# Patient Record
Sex: Male | Born: 1980 | Race: White | Hispanic: No | Marital: Single | Smoking: Never smoker
Health system: Southern US, Community
[De-identification: ages and names within clinical notes are randomized; demographics above are authoritative.]

---
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
## 2004-09-25 ENCOUNTER — Encounter: Admission: RE | Admit: 2004-09-25 | Discharge: 2004-10-08 | Payer: Self-pay | Admitting: Family Medicine

## 2014-09-30 ENCOUNTER — Encounter (HOSPITAL_BASED_OUTPATIENT_CLINIC_OR_DEPARTMENT_OTHER): Payer: Self-pay

## 2014-09-30 ENCOUNTER — Emergency Department (HOSPITAL_BASED_OUTPATIENT_CLINIC_OR_DEPARTMENT_OTHER): Payer: BLUE CROSS/BLUE SHIELD

## 2014-09-30 ENCOUNTER — Emergency Department (HOSPITAL_BASED_OUTPATIENT_CLINIC_OR_DEPARTMENT_OTHER)
Admission: EM | Admit: 2014-09-30 | Discharge: 2014-09-30 | Disposition: A | Discharge status: Home / Self Care | Payer: BLUE CROSS/BLUE SHIELD | Attending: Emergency Medicine | Admitting: Emergency Medicine

## 2014-09-30 DIAGNOSIS — Y939 Activity, unspecified: Secondary | ICD-10-CM | POA: Insufficient documentation

## 2014-09-30 DIAGNOSIS — X30XXXA Exposure to excessive natural heat, initial encounter: Secondary | ICD-10-CM | POA: Diagnosis not present

## 2014-09-30 DIAGNOSIS — R410 Disorientation, unspecified: Secondary | ICD-10-CM

## 2014-09-30 DIAGNOSIS — R4182 Altered mental status, unspecified: Secondary | ICD-10-CM | POA: Diagnosis present

## 2014-09-30 DIAGNOSIS — F10120 Alcohol abuse with intoxication, uncomplicated: Secondary | ICD-10-CM | POA: Diagnosis not present

## 2014-09-30 DIAGNOSIS — F10920 Alcohol use, unspecified with intoxication, uncomplicated: Secondary | ICD-10-CM

## 2014-09-30 DIAGNOSIS — Y9289 Other specified places as the place of occurrence of the external cause: Secondary | ICD-10-CM | POA: Insufficient documentation

## 2014-09-30 DIAGNOSIS — T675XXA Heat exhaustion, unspecified, initial encounter: Secondary | ICD-10-CM

## 2014-09-30 DIAGNOSIS — Y999 Unspecified external cause status: Secondary | ICD-10-CM | POA: Insufficient documentation

## 2014-09-30 LAB — URINALYSIS, ROUTINE W REFLEX MICROSCOPIC
BILIRUBIN URINE: NEGATIVE
GLUCOSE, UA: NEGATIVE mg/dL
HGB URINE DIPSTICK: NEGATIVE
Ketones, ur: NEGATIVE mg/dL
LEUKOCYTES UA: NEGATIVE
NITRITE: NEGATIVE
PROTEIN: NEGATIVE mg/dL
Specific Gravity, Urine: 1.009 (ref 1.005–1.030)
Urobilinogen, UA: 0.2 mg/dL (ref 0.0–1.0)
pH: 5.5 (ref 5.0–8.0)

## 2014-09-30 LAB — COMPREHENSIVE METABOLIC PANEL
ALT: 46 U/L (ref 17–63)
ANION GAP: 10 (ref 5–15)
AST: 29 U/L (ref 15–41)
Albumin: 4.4 g/dL (ref 3.5–5.0)
Alkaline Phosphatase: 49 U/L (ref 38–126)
BILIRUBIN TOTAL: 0.2 mg/dL — AB (ref 0.3–1.2)
BUN: 16 mg/dL (ref 6–20)
CHLORIDE: 107 mmol/L (ref 101–111)
CO2: 22 mmol/L (ref 22–32)
CREATININE: 1.16 mg/dL (ref 0.61–1.24)
Calcium: 8.2 mg/dL — ABNORMAL LOW (ref 8.9–10.3)
Glucose, Bld: 111 mg/dL — ABNORMAL HIGH (ref 65–99)
Potassium: 3.7 mmol/L (ref 3.5–5.1)
Sodium: 139 mmol/L (ref 135–145)
TOTAL PROTEIN: 7.4 g/dL (ref 6.5–8.1)

## 2014-09-30 LAB — CBC WITH DIFFERENTIAL/PLATELET
BASOS ABS: 0 10*3/uL (ref 0.0–0.1)
Basophils Relative: 1 % (ref 0–1)
Eosinophils Absolute: 0.1 10*3/uL (ref 0.0–0.7)
Eosinophils Relative: 2 % (ref 0–5)
HEMATOCRIT: 42 % (ref 39.0–52.0)
Hemoglobin: 14 g/dL (ref 13.0–17.0)
LYMPHS PCT: 42 % (ref 12–46)
Lymphs Abs: 2.7 10*3/uL (ref 0.7–4.0)
MCH: 31.5 pg (ref 26.0–34.0)
MCHC: 33.3 g/dL (ref 30.0–36.0)
MCV: 94.4 fL (ref 78.0–100.0)
MONOS PCT: 7 % (ref 3–12)
Monocytes Absolute: 0.4 10*3/uL (ref 0.1–1.0)
Neutro Abs: 3.3 10*3/uL (ref 1.7–7.7)
Neutrophils Relative %: 50 % (ref 43–77)
Platelets: 248 10*3/uL (ref 150–400)
RBC: 4.45 MIL/uL (ref 4.22–5.81)
RDW: 13.6 % (ref 11.5–15.5)
WBC: 6.6 10*3/uL (ref 4.0–10.5)

## 2014-09-30 LAB — RAPID URINE DRUG SCREEN, HOSP PERFORMED
Amphetamines: NOT DETECTED
BARBITURATES: NOT DETECTED
Benzodiazepines: NOT DETECTED
Cocaine: NOT DETECTED
Opiates: NOT DETECTED
Tetrahydrocannabinol: NOT DETECTED

## 2014-09-30 LAB — ETHANOL: Alcohol, Ethyl (B): 254 mg/dL — ABNORMAL HIGH (ref <5)

## 2014-09-30 LAB — TROPONIN I

## 2014-09-30 MED ORDER — SODIUM CHLORIDE 0.9 % IV BOLUS (SEPSIS)
1000.0000 mL | Freq: Once | INTRAVENOUS | Status: AC
  Administered 2014-09-30: 1000 mL via INTRAVENOUS

## 2014-09-30 MED ORDER — SODIUM CHLORIDE 0.9 % IV SOLN
1000.0000 mL | Freq: Once | INTRAVENOUS | Status: AC
  Administered 2014-09-30: 1000 mL via INTRAVENOUS

## 2014-09-30 NOTE — ED Notes (Addendum)
Pt presents with decreased LOC, shortness of breath, brother in law at side and report patient with ETOH use today. Pt lethargic, but verbal, answers questions appropriately. Pt was at the pool (since 1430) when his girlfriend called brother in law because she could not arouse patient. PT reports drinking several powerade drinks this morning, 10 beers, and 2 bottles of water. No obvious injuries noted. Pt's brother in law denies any incidents/injuries on scene.

## 2014-09-30 NOTE — ED Provider Notes (Signed)
CSN: [id number]     Arrival date & time 09/30/14  1908 History  This chart was scribed for Arby Barrette, MD by Abel Presto, ED Scribe. This patient was seen in room MH01/MH01 and the patient's care was started at 7:41 PM.    Chief Complaint  Patient presents with  . Altered Mental Status     The history is provided by the patient and a friend. No language interpreter was used.   HPI Comments: Evan Hall is a 34 y.o. male who presents to the Emergency Department complaining of AMS with onset around 6:45 PM. Pt was at the pool since 2:30 PM, stating he was in the water during most of the time. Pt drank approximately 10 beers during the course of 6 hours. Pt states he got out of the pool and his "stomach got upset" so he went to use the restroom. He states he came back and sat down and reports "I just couldn't function, I was out of it, couldn't put thoughts together, kinda like a zombie mode." Pt notes "funny feeling" in lower posterior head. Per nursing noted girlfriend reported associated LOC. Pt states he does not feel at baseline yet. He reports he feels lethargic and weak. He denies any pain. Pt denies any significant PMHx of FHx. Pt denies known injury, fever, chills, and recent illness. Pt A&O x 4.   History reviewed. No pertinent past medical history. History reviewed. No pertinent past surgical history. History reviewed. No pertinent family history. History  Substance Use Topics  . Smoking status: Never Smoker   . Smokeless tobacco: Not on file  . Alcohol Use: Yes     Comment: daily - today -     Review of Systems 10 Systems reviewed and all are negative for acute change except as noted in the HPI.     Allergies  Review of patient's allergies indicates no known allergies.  Home Medications   Prior to Admission medications   Medication Sig Start Date End Date Taking? Authorizing Provider  Esomeprazole Magnesium (NEXIUM PO) Take by mouth.   Yes Historical Provider,  MD   BP 113/76 mmHg  Pulse 66  Temp(Src) 98.1 F (36.7 C) (Oral)  Resp 18  Ht 6\' 2"  (1.88 m)  Wt 235 lb (106.595 kg)  BMI 30.16 kg/m2  SpO2 100% Physical Exam  Constitutional: He is oriented to person, place, and time. He appears well-developed and well-nourished.  The patient appears mildly somnolent and slightly delayed in his responses but appropriate. No respiratory distress. GCS is 15.  HENT:  Head: Normocephalic and atraumatic.  Right Ear: External ear normal.  Left Ear: External ear normal.  Nose: Nose normal.  Mouth/Throat: Oropharynx is clear and moist. No oropharyngeal exudate.  Eyes: EOM are normal. Pupils are equal, round, and reactive to light.  Mild conjunctival: Injection bilaterally. Patient has lateral my nystagmus in both directions with far gaze.  Neck: Neck supple.  No C-spine tenderness.  Cardiovascular: Normal rate, regular rhythm, normal heart sounds and intact distal pulses.   Pulmonary/Chest: Effort normal and breath sounds normal.  Abdominal: Soft. Bowel sounds are normal. He exhibits no distension. There is no tenderness.  Musculoskeletal: Normal range of motion. He exhibits no edema.  Neurological: He is alert and oriented to person, place, and time. He has normal strength. No cranial nerve deficit. He exhibits normal muscle tone. Coordination normal. GCS eye subscore is 4. GCS verbal subscore is 5. GCS motor subscore is 6.  Skin: Skin is  warm, dry and intact.  Psychiatric: He has a normal mood and affect.    ED Course  Procedures (including critical care time) DIAGNOSTIC STUDIES: Oxygen Saturation is 100% on room air, normal by my interpretation.    COORDINATION OF CARE: 7:48 PM Discussed treatment plan with patient at beside, the patient agrees with the plan and has no further questions at this time.   Labs Review Labs Reviewed  COMPREHENSIVE METABOLIC PANEL - Abnormal; Notable for the following:    Glucose, Bld 111 (*)    Calcium 8.2 (*)     Total Bilirubin 0.2 (*)    All other components within normal limits  ETHANOL - Abnormal; Notable for the following:    Alcohol, Ethyl (B) 254 (*)    All other components within normal limits  URINE CULTURE  CBC WITH DIFFERENTIAL/PLATELET  URINALYSIS, ROUTINE W REFLEX MICROSCOPIC (NOT AT [facility])  URINE RAPID DRUG SCREEN, HOSP PERFORMED  TROPONIN I    Imaging Review Ct Head Wo Contrast  09/30/2014   CLINICAL DATA:  Altered mental status beginning 90 minutes ago.  EXAM: CT HEAD WITHOUT CONTRAST  TECHNIQUE: Contiguous axial images were obtained from the base of the skull through the vertex without intravenous contrast.  COMPARISON:  None.  FINDINGS: The study is somewhat degraded by motion artifact. Allowing for that, the examination is normal. No evidence of old or acute infarction, mass lesion, hemorrhage, hydrocephalus or extra-axial collection. Calvarium is unremarkable. Sinuses, middle ears and mastoids are clear.  IMPRESSION: Motion degraded but normal appearing exam.   Electronically Signed   By: Paulina Fusi M.D.   On: 09/30/2014 20:27     EKG Interpretation None     Recheck at 2245 patient is much improved. Mental status is clear. He has been interactive with family members. He is remained oriented with no signs of confusion MDM   Final diagnoses:  Heat exhaustion, initial encounter  Alcohol intoxication, uncomplicated  Disorientation   Patient presents with mental status change after having been at the pool for much of the day. The patient had had approximately 10 beers over the course of 6 hours and had been outside at the pool for a number of hours. There was no reported injury. The patient reports he just felt very disoriented and had to lie down without any localizing neurologic dysfunction. CT head does not show evidence of intracranial bleed or occult injury. Patient did not have documented fever. At this time symptoms are most suggestive of heat exhaustion with alcohol  intoxication. The patient's symptoms have resolved with administration of fluids and time.    Arby Barrette, MD 09/30/14 2257

## 2014-09-30 NOTE — Discharge Instructions (Signed)
Confusion Confusion is the inability to think with your usual speed or clarity. Confusion may come on quickly or slowly over time. How quickly the confusion comes on depends on the cause. Confusion can be due to any number of causes. CAUSES   Concussion, head injury, or head trauma.  Seizures.  Stroke.  Fever.  Brain tumor.  Age related decreased brain function (dementia).  Heightened emotional states like rage or terror.  Mental illness in which the person loses the ability to determine what is real and what is not (hallucinations).  Infections such as a urinary tract infection (UTI).  Toxic effects from alcohol, drugs, or prescription medicines.  Dehydration and an imbalance of salts in the body (electrolytes).  Lack of sleep.  Low blood sugar (diabetes).  Low levels of oxygen from conditions such as chronic lung disorders.  Drug interactions or other medicine side effects.  Nutritional deficiencies, especially niacin, thiamine, vitamin C, or vitamin B.  Sudden drop in body temperature (hypothermia).  Change in routine, such as when traveling or hospitalized. SIGNS AND SYMPTOMS  People often describe their thinking as cloudy or unclear when they are confused. Confusion can also include feeling disoriented. That means you are unaware of where or who you are. You may also not know what the date or time is. If confused, you may also have difficulty paying attention, remembering, and making decisions. Some people also act aggressively when they are confused.  DIAGNOSIS  The medical evaluation of confusion may include:  Blood and urine tests.  X-rays.  Brain and nervous system tests.  Analyzing your brain waves (electroencephalogram or EEG).  Magnetic resonance imaging (MRI) of your head.  Computed tomography (CT) scan of your head.  Mental status tests in which your health care provider may ask many questions. Some of these questions may seem silly or strange,  but they are a very important test to help diagnose and treat confusion. TREATMENT  An admission to the hospital may not be needed, but a person with confusion should not be left alone. Stay with a family member or friend until the confusion clears. Avoid alcohol, pain relievers, or sedative drugs until you have fully recovered. Do not drive until directed by your health care provider. HOME CARE INSTRUCTIONS  What family and friends can do:  To find out if someone is confused, ask the person to state his or her name, age, and the date. If the person is unsure or answers incorrectly, he or she is confused.  Always introduce yourself, no matter how well the person knows you.  Often remind the person of his or her location.  Place a calendar and clock near the confused person.  Help the person with his or her medicines. You may want to use a pill box, an alarm as a reminder, or give the person each dose as prescribed.  Talk about current events and plans for the day.  Try to keep the environment calm, quiet, and peaceful.  Make sure the person keeps follow-up visits with his or her health care provider. PREVENTION  Ways to prevent confusion:  Avoid alcohol.  Eat a balanced diet.  Get enough sleep.  Take medicine only as directed by your health care provider.  Do not become isolated. Spend time with other people and make plans for your days.  Keep careful watch on your blood sugar levels if you are diabetic. SEEK IMMEDIATE MEDICAL CARE IF:   You develop severe headaches, repeated vomiting, seizures, blackouts, or  slurred speech.  There is increasing confusion, weakness, numbness, restlessness, or personality changes.  You develop a loss of balance, have marked dizziness, feel uncoordinated, or fall.  You have delusions, hallucinations, or develop severe anxiety.  Your family members think you need to be rechecked. Document Released: 05/15/2004 Document Revised: 08/22/2013  Document Reviewed: 05/13/2013 [facility] Patient Information 2015 [place], [place]. This information is not intended to replace advice given to you by your health care provider. Make sure you discuss any questions you have with your health care provider. Heat-Related Illness Heat-related illnesses occur when the body is unable to properly cool itself. The body normally cools itself by sweating. However, under some conditions sweating is not enough. In these cases, a person's body temperature rises rapidly. Very high body temperatures may damage the brain or other vital organs. Some examples of heat-related illnesses include:  Heat stroke. This occurs when the body is unable to regulate its temperature. The body's temperature rises rapidly, the sweating mechanism fails, and the body is unable to cool down. Body temperature may rise to 106 F (41 C) or higher within 10 to 15 minutes. Heat stroke can cause death or permanent disability if emergency treatment is not provided.  Heat exhaustion. This is a milder form of heat-related illness that can develop after several days of exposure to high temperatures and not enough fluids. It is the body's response to an excessive loss of the water and salt contained in sweat.  Heat cramps. These usually affect people who sweat a lot during heavy activity. This sweating drains the body's salt and moisture. The low salt level in the muscles causes painful cramps. Heat cramps may also be a symptom of heat exhaustion. Heat cramps usually occur in the abdomen, arms, or legs. Get medical attention for cramps if you have heart problems or are on a low-sodium diet. Those that are at greatest risk for heat-related illnesses include:   The elderly.  Infant and the very young.  People with mental illness and chronic diseases.  People who are overweight (obese).  Young and healthy people can even succumb to heat if they participate in strenuous physical activities during  hot weather. CAUSES  Several factors affect the body's ability to cool itself during extremely hot weather. When the humidity is high, sweat will not evaporate as quickly. This prevents the body from releasing heat quickly. Other factors that can affect the body's ability to cool down include:   Age.  Obesity.  Fever.  Dehydration.  Heart disease.  Mental illness.  Poor circulation.  Sunburn.  Prescription drug use.  Alcohol use. SYMPTOMS  Heat stroke: Warning signs of heat stroke vary, but may include:  An extremely high body temperature (above 103F orally).  A fast, strong pulse.  Dizziness.  Confusion.  Red, hot, and dry skin.  No sweating.  Throbbing headache.  Feeling sick to your stomach (nauseous).  Unconsciousness. Heat exhaustion: Warning signs of heat exhaustion include:  Heavy sweating.  Tiredness.  Headache.  Paleness.  Weakness.  Feeling sick to your stomach (nauseous) or vomiting.  Muscle cramps. Heat cramps  Muscle pains or spasms. TREATMENT  Heat stroke  Get into a cool environment. An indoor place that is air-conditioned may be best.  Take a cool shower or bath. Have someone around to make sure you are okay.  Take your temperature. Make sure it is going down. Heat exhaustion  Drink plenty of fluids. Do not drink liquids that contain caffeine, alcohol, or large amounts  of sugar. These cause you to lose more body fluid. Also, avoid very cold drinks. They can cause stomach cramps.  Get into a cool environment. An indoor place that is air-conditioned may be best.  Take a cool shower or bath. Have someone around to make sure you are okay.  Put on lightweight clothing. Heat cramps  Stop whatever activity you were doing. Do not attempt to do that activity for at least 3 hours after the cramps have gone away.  Get into a cool environment. An indoor place that is air-conditioned may be best. HOME CARE INSTRUCTIONS  To  protect your health when temperatures are extremely high, follow these tips:  During heavy exercise in a hot environment, drink two to four glasses (16-32 ounces) of cool fluids each hour. Do not wait until you are thirsty to drink. Warning: If your caregiver limits the amount of fluid you drink or has you on water pills, ask how much you should drink while the weather is hot.  Do not drink liquids that contain caffeine, alcohol, or large amounts of sugar. These cause you to lose more body fluid.  Avoid very cold drinks. They can cause stomach cramps.  Wear appropriate clothing. Choose lightweight, light-colored, loose-fitting clothing.  If you must be outdoors, try to limit your outdoor activity to morning and evening hours. Try to rest often in shady areas.  If you are not used to working or exercising in a hot environment, start slowly and pick up the pace gradually.  Stay cool in an air-conditioned place if possible. If your home does not have air conditioning, go to the shopping mall or [employer].  Taking a cool shower or bath may help you cool off. SEEK MEDICAL CARE IF:   You see any of the symptoms listed above. You may be dealing with a life-threatening emergency.  Symptoms worsen or last longer than 1 hour.  Heat cramps do not get better in 1 hour. MAKE SURE YOU:   Understand these instructions.  Will watch your condition.  Will get help right away if you are not doing well or get worse. Document Released: 01/15/2008 Document Revised: 06/30/2011 Document Reviewed: 01/15/2008 [facility] Patient Information 2015 [place], [place]. This information is not intended to replace advice given to you by your health care provider. Make sure you discuss any questions you have with your health care provider. Alcohol Intoxication Alcohol intoxication occurs when the amount of alcohol that a person has consumed impairs his or her ability to mentally and physically function. Alcohol  directly impairs the normal chemical activity of the brain. Drinking large amounts of alcohol can lead to changes in mental function and behavior, and it can cause many physical effects that can be harmful.  Alcohol intoxication can range in severity from mild to very severe. Various factors can affect the level of intoxication that occurs, such as the person's age, gender, weight, frequency of alcohol consumption, and the presence of other medical conditions (such as diabetes, seizures, or heart conditions). Dangerous levels of alcohol intoxication may occur when people drink large amounts of alcohol in a short period (binge drinking). Alcohol can also be especially dangerous when combined with certain prescription medicines or "recreational" drugs. SIGNS AND SYMPTOMS Some common signs and symptoms of mild alcohol intoxication include:  Loss of coordination.  Changes in mood and behavior.  Impaired judgment.  Slurred speech. As alcohol intoxication progresses to more severe levels, other signs and symptoms will appear. These may include:  Vomiting.  Confusion and impaired memory.  Slowed breathing.  Seizures.  Loss of consciousness. DIAGNOSIS  Your health care provider will take a medical history and perform a physical exam. You will be asked about the amount and type of alcohol you have consumed. Blood tests will be done to measure the concentration of alcohol in your blood. In many places, your blood alcohol level must be lower than 80 mg/dL (8.11%) to legally drive. However, many dangerous effects of alcohol can occur at much lower levels.  TREATMENT  People with alcohol intoxication often do not require treatment. Most of the effects of alcohol intoxication are temporary, and they go away as the alcohol naturally leaves the body. Your health care provider will monitor your condition until you are stable enough to go home. Fluids are sometimes given through an IV access tube to help  prevent dehydration.  HOME CARE INSTRUCTIONS  Do not drive after drinking alcohol.  Stay hydrated. Drink enough water and fluids to keep your urine clear or pale yellow. Avoid caffeine.   Only take over-the-counter or prescription medicines as directed by your health care provider.  SEEK MEDICAL CARE IF:   You have persistent vomiting.   You do not feel better after a few days.  You have frequent alcohol intoxication. Your health care provider can help determine if you should see a substance use treatment counselor. SEEK IMMEDIATE MEDICAL CARE IF:   You become shaky or tremble when you try to stop drinking.   You shake uncontrollably (seizure).   You throw up (vomit) blood. This may be bright red or may look like black coffee grounds.   You have blood in your stool. This may be bright red or may appear as a black, tarry, bad smelling stool.   You become lightheaded or faint.  MAKE SURE YOU:   Understand these instructions.  Will watch your condition.  Will get help right away if you are not doing well or get worse. Document Released: 01/15/2005 Document Revised: 12/08/2012 Document Reviewed: 09/10/2012 [facility] Patient Information 2015 [place], [place]. This information is not intended to replace advice given to you by your health care provider. Make sure you discuss any questions you have with your health care provider.

## 2014-09-30 NOTE — ED Notes (Signed)
MD at bedside. 

## 2014-09-30 NOTE — ED Notes (Signed)
Pt alert and oriented. Ambulates without difficulty. No slurred speech or sob. Danna Hefty, RN

## 2014-09-30 NOTE — ED Notes (Signed)
No complaints when ambulating.  Pt noted to have a steady gait.

## 2014-10-01 LAB — URINE CULTURE
CULTURE: NO GROWTH
Colony Count: NO GROWTH

## 2015-10-31 DIAGNOSIS — H53002 Unspecified amblyopia, left eye: Secondary | ICD-10-CM | POA: Diagnosis not present

## 2015-10-31 DIAGNOSIS — H5203 Hypermetropia, bilateral: Secondary | ICD-10-CM | POA: Diagnosis not present

## 2015-12-21 DIAGNOSIS — J329 Chronic sinusitis, unspecified: Secondary | ICD-10-CM | POA: Diagnosis not present

## 2016-01-22 DIAGNOSIS — Z23 Encounter for immunization: Secondary | ICD-10-CM | POA: Diagnosis not present

## 2016-02-18 DIAGNOSIS — Z209 Contact with and (suspected) exposure to unspecified communicable disease: Secondary | ICD-10-CM | POA: Diagnosis not present

## 2016-04-25 DIAGNOSIS — Z0000 Encounter for general adult medical examination without abnormal findings: Secondary | ICD-10-CM | POA: Diagnosis not present

## 2016-10-31 DIAGNOSIS — H53002 Unspecified amblyopia, left eye: Secondary | ICD-10-CM | POA: Diagnosis not present

## 2016-10-31 DIAGNOSIS — H5203 Hypermetropia, bilateral: Secondary | ICD-10-CM | POA: Diagnosis not present

## 2016-10-31 DIAGNOSIS — H52203 Unspecified astigmatism, bilateral: Secondary | ICD-10-CM | POA: Diagnosis not present

## 2016-12-09 IMAGING — CT CT HEAD W/O CM
1 of 3 series · 13 of 30 positions shown, 17 images · non-contrast
Comparison: None.

CLINICAL DATA: Altered mental status beginning 90 minutes ago.

EXAM:
CT HEAD WITHOUT CONTRAST
TECHNIQUE: Contiguous axial images were obtained from the base of the skull
through the vertex without intravenous contrast.

[Series 2: head 4.8 h37s · axial · 0.49mm/px · z∈[+1269,+1419]mm · 13 of 36 slices shown, 17 images]
[im 3/36  brain]
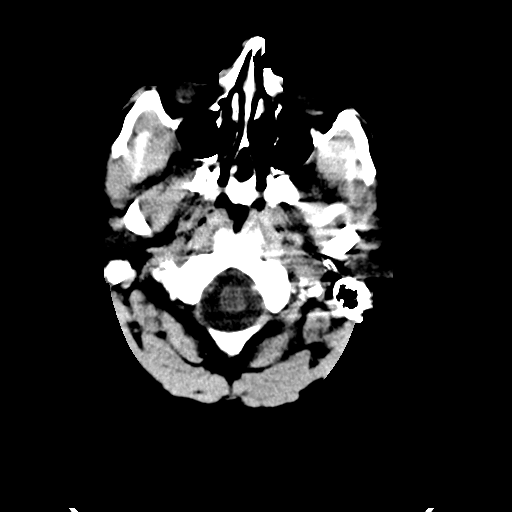
[im 3/36  bone]
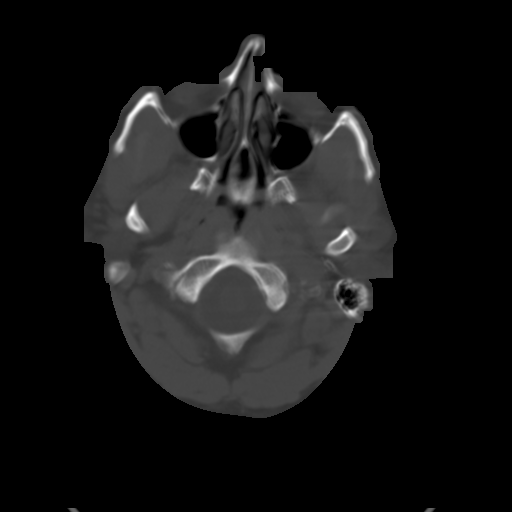
[im 6/36  brain]
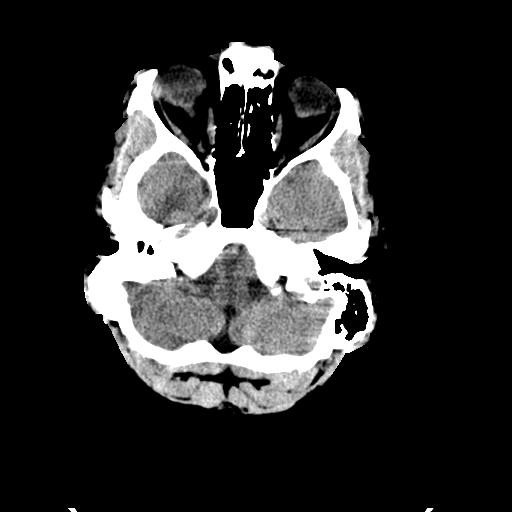
[im 8/36  brain]
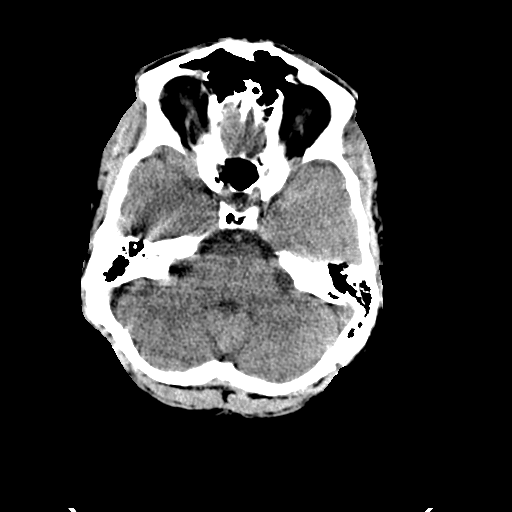
[im 11/36  brain]
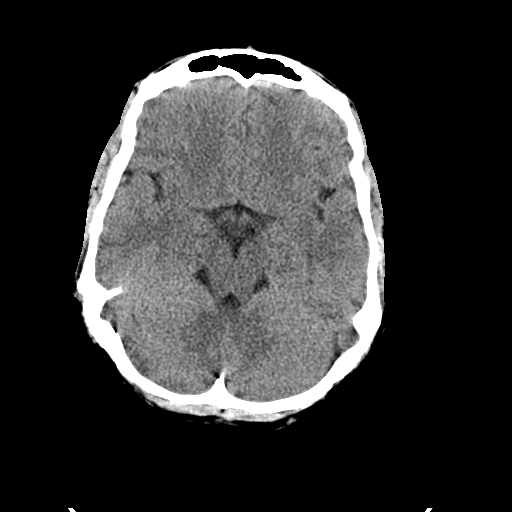
[im 13/36  brain]
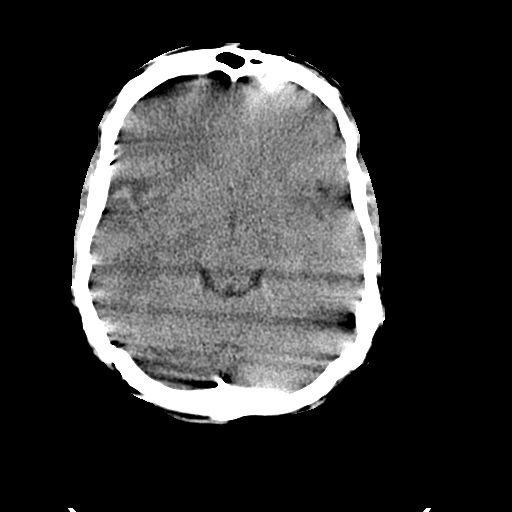
[im 13/36  bone]
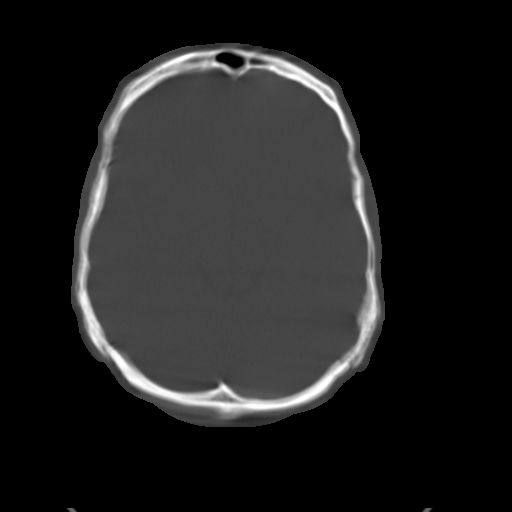
[im 16/36  brain]
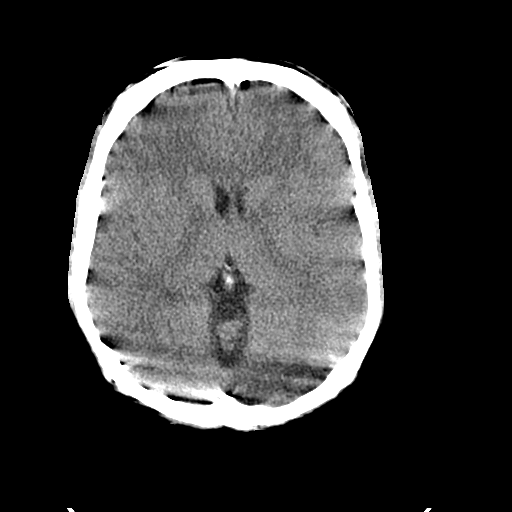
[im 18/36  brain]
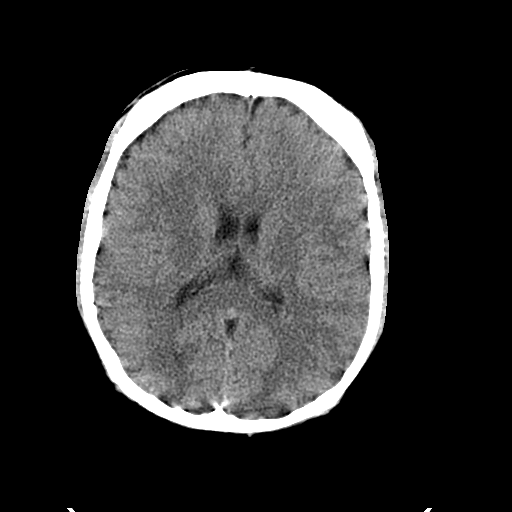
[im 21/36  brain]
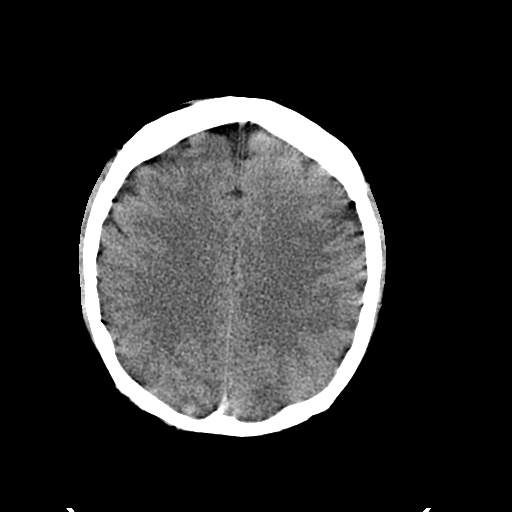
[im 23/36  brain]
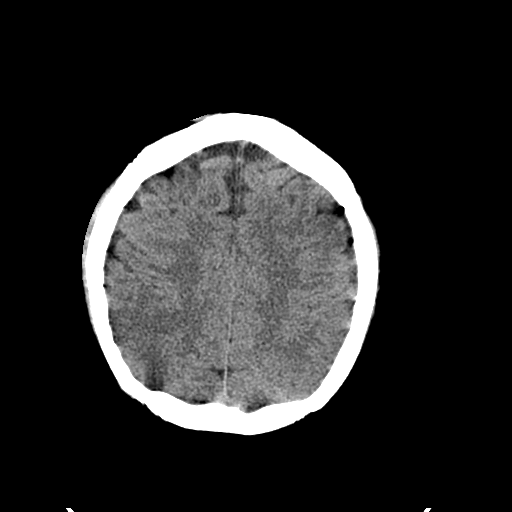
[im 23/36  bone]
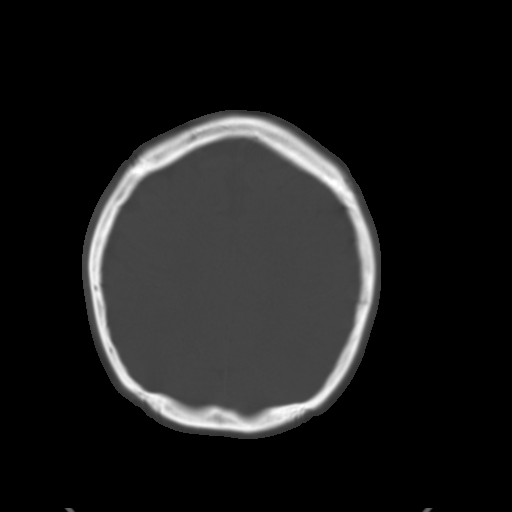
[im 26/36  brain]
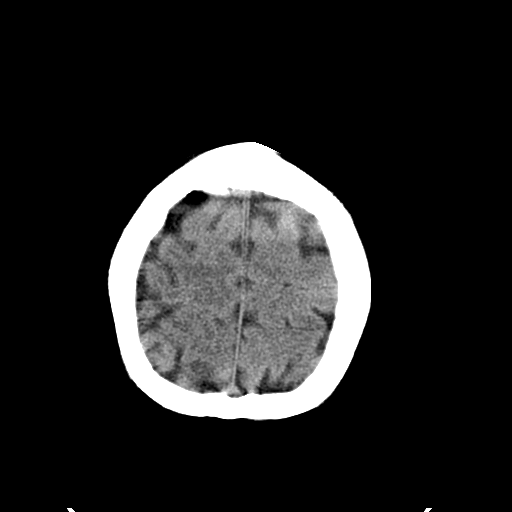
[im 28/36  brain]
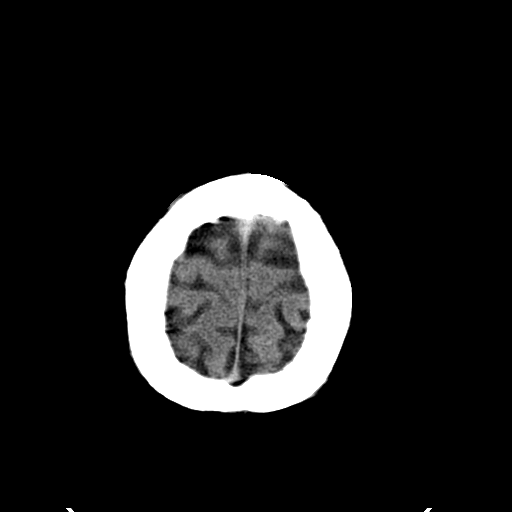
[im 31/36  brain]
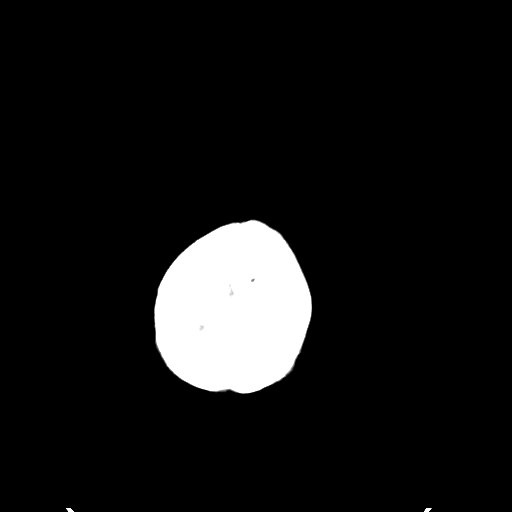
[im 33/36  brain]
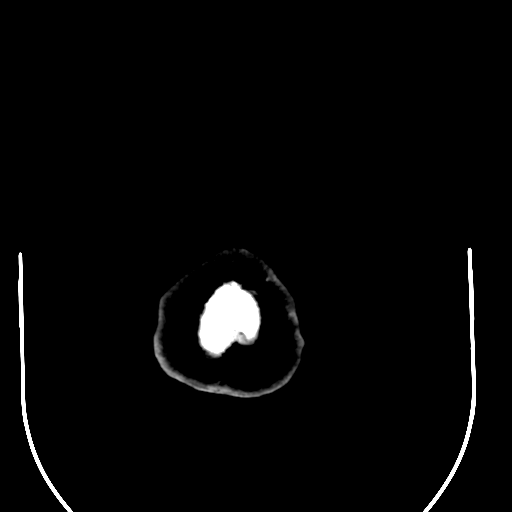
[im 33/36  bone]
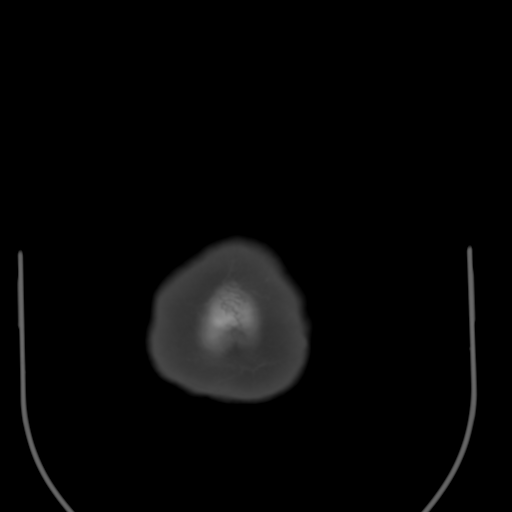

[13 of 30 positions shown; findings below may reference images not displayed]

FINDINGS: The study is somewhat degraded by motion artifact. Allowing for
that, the examination is normal. No evidence of old or acute
infarction, mass lesion, hemorrhage, hydrocephalus or extra-axial
collection. Calvarium is unremarkable. Sinuses, middle ears and
mastoids are clear.
IMPRESSION: Motion degraded but normal appearing exam.

## 2016-12-15 DIAGNOSIS — I10 Essential (primary) hypertension: Secondary | ICD-10-CM | POA: Diagnosis not present

## 2016-12-15 DIAGNOSIS — R0789 Other chest pain: Secondary | ICD-10-CM | POA: Diagnosis not present

## 2016-12-16 DIAGNOSIS — I10 Essential (primary) hypertension: Secondary | ICD-10-CM | POA: Diagnosis not present

## 2017-01-06 DIAGNOSIS — E7800 Pure hypercholesterolemia, unspecified: Secondary | ICD-10-CM | POA: Diagnosis not present

## 2017-01-06 DIAGNOSIS — I10 Essential (primary) hypertension: Secondary | ICD-10-CM | POA: Diagnosis not present

## 2017-05-12 DIAGNOSIS — Z13220 Encounter for screening for lipoid disorders: Secondary | ICD-10-CM | POA: Diagnosis not present

## 2017-05-12 DIAGNOSIS — Z0000 Encounter for general adult medical examination without abnormal findings: Secondary | ICD-10-CM | POA: Diagnosis not present

## 2017-09-18 DIAGNOSIS — E782 Mixed hyperlipidemia: Secondary | ICD-10-CM | POA: Diagnosis not present

## 2017-09-22 DIAGNOSIS — E7800 Pure hypercholesterolemia, unspecified: Secondary | ICD-10-CM | POA: Diagnosis not present

## 2017-09-22 DIAGNOSIS — K219 Gastro-esophageal reflux disease without esophagitis: Secondary | ICD-10-CM | POA: Diagnosis not present

## 2017-10-12 DIAGNOSIS — M25572 Pain in left ankle and joints of left foot: Secondary | ICD-10-CM | POA: Diagnosis not present

## 2017-11-07 DIAGNOSIS — Z6832 Body mass index (BMI) 32.0-32.9, adult: Secondary | ICD-10-CM | POA: Diagnosis not present

## 2017-11-07 DIAGNOSIS — J019 Acute sinusitis, unspecified: Secondary | ICD-10-CM | POA: Diagnosis not present

## 2017-11-11 DIAGNOSIS — H53002 Unspecified amblyopia, left eye: Secondary | ICD-10-CM | POA: Diagnosis not present

## 2017-11-11 DIAGNOSIS — H524 Presbyopia: Secondary | ICD-10-CM | POA: Diagnosis not present

## 2017-11-11 DIAGNOSIS — H52203 Unspecified astigmatism, bilateral: Secondary | ICD-10-CM | POA: Diagnosis not present

## 2017-11-24 DIAGNOSIS — J0140 Acute pansinusitis, unspecified: Secondary | ICD-10-CM | POA: Diagnosis not present

## 2018-02-11 DIAGNOSIS — E7800 Pure hypercholesterolemia, unspecified: Secondary | ICD-10-CM | POA: Diagnosis not present

## 2018-02-11 DIAGNOSIS — J329 Chronic sinusitis, unspecified: Secondary | ICD-10-CM | POA: Diagnosis not present

## 2018-02-11 DIAGNOSIS — M7702 Medial epicondylitis, left elbow: Secondary | ICD-10-CM | POA: Diagnosis not present

## 2018-02-18 DIAGNOSIS — A084 Viral intestinal infection, unspecified: Secondary | ICD-10-CM | POA: Diagnosis not present

## 2018-03-11 DIAGNOSIS — B084 Enteroviral vesicular stomatitis with exanthem: Secondary | ICD-10-CM | POA: Diagnosis not present

## 2018-06-08 DIAGNOSIS — Z0000 Encounter for general adult medical examination without abnormal findings: Secondary | ICD-10-CM | POA: Diagnosis not present

## 2018-06-10 DIAGNOSIS — K219 Gastro-esophageal reflux disease without esophagitis: Secondary | ICD-10-CM | POA: Diagnosis not present

## 2018-06-10 DIAGNOSIS — M549 Dorsalgia, unspecified: Secondary | ICD-10-CM | POA: Diagnosis not present

## 2018-06-10 DIAGNOSIS — E559 Vitamin D deficiency, unspecified: Secondary | ICD-10-CM | POA: Diagnosis not present

## 2018-06-10 DIAGNOSIS — Z0000 Encounter for general adult medical examination without abnormal findings: Secondary | ICD-10-CM | POA: Diagnosis not present

## 2018-06-10 DIAGNOSIS — E7800 Pure hypercholesterolemia, unspecified: Secondary | ICD-10-CM | POA: Diagnosis not present

## 2018-06-28 DIAGNOSIS — M545 Low back pain: Secondary | ICD-10-CM | POA: Diagnosis not present

## 2018-06-30 DIAGNOSIS — M545 Low back pain: Secondary | ICD-10-CM | POA: Diagnosis not present

## 2018-10-04 DIAGNOSIS — Z20828 Contact with and (suspected) exposure to other viral communicable diseases: Secondary | ICD-10-CM | POA: Diagnosis not present

## 2018-11-24 DIAGNOSIS — H52203 Unspecified astigmatism, bilateral: Secondary | ICD-10-CM | POA: Diagnosis not present

## 2018-11-24 DIAGNOSIS — H5203 Hypermetropia, bilateral: Secondary | ICD-10-CM | POA: Diagnosis not present

## 2018-11-24 DIAGNOSIS — H53002 Unspecified amblyopia, left eye: Secondary | ICD-10-CM | POA: Diagnosis not present

## 2019-02-25 DIAGNOSIS — Z20828 Contact with and (suspected) exposure to other viral communicable diseases: Secondary | ICD-10-CM | POA: Diagnosis not present

## 2019-03-22 DIAGNOSIS — S058X1A Other injuries of right eye and orbit, initial encounter: Secondary | ICD-10-CM | POA: Diagnosis not present

## 2019-03-29 DIAGNOSIS — H33301 Unspecified retinal break, right eye: Secondary | ICD-10-CM | POA: Diagnosis not present

## 2019-03-29 DIAGNOSIS — H2000 Unspecified acute and subacute iridocyclitis: Secondary | ICD-10-CM | POA: Diagnosis not present

## 2019-04-01 DIAGNOSIS — Z20828 Contact with and (suspected) exposure to other viral communicable diseases: Secondary | ICD-10-CM | POA: Diagnosis not present

## 2019-04-01 DIAGNOSIS — Z03818 Encounter for observation for suspected exposure to other biological agents ruled out: Secondary | ICD-10-CM | POA: Diagnosis not present

## 2019-05-20 DIAGNOSIS — Z20828 Contact with and (suspected) exposure to other viral communicable diseases: Secondary | ICD-10-CM | POA: Diagnosis not present

## 2019-05-20 DIAGNOSIS — Z03818 Encounter for observation for suspected exposure to other biological agents ruled out: Secondary | ICD-10-CM | POA: Diagnosis not present

## 2019-06-29 DIAGNOSIS — E7800 Pure hypercholesterolemia, unspecified: Secondary | ICD-10-CM | POA: Diagnosis not present

## 2019-06-29 DIAGNOSIS — Z0000 Encounter for general adult medical examination without abnormal findings: Secondary | ICD-10-CM | POA: Diagnosis not present

## 2019-06-29 DIAGNOSIS — B009 Herpesviral infection, unspecified: Secondary | ICD-10-CM | POA: Diagnosis not present

## 2019-06-29 DIAGNOSIS — K219 Gastro-esophageal reflux disease without esophagitis: Secondary | ICD-10-CM | POA: Diagnosis not present

## 2019-06-29 DIAGNOSIS — E559 Vitamin D deficiency, unspecified: Secondary | ICD-10-CM | POA: Diagnosis not present

## 2019-12-05 DIAGNOSIS — H53002 Unspecified amblyopia, left eye: Secondary | ICD-10-CM | POA: Diagnosis not present

## 2019-12-05 DIAGNOSIS — H52203 Unspecified astigmatism, bilateral: Secondary | ICD-10-CM | POA: Diagnosis not present

## 2019-12-05 DIAGNOSIS — H5203 Hypermetropia, bilateral: Secondary | ICD-10-CM | POA: Diagnosis not present

## 2019-12-05 DIAGNOSIS — S058X1A Other injuries of right eye and orbit, initial encounter: Secondary | ICD-10-CM | POA: Diagnosis not present

## 2020-05-01 DIAGNOSIS — J069 Acute upper respiratory infection, unspecified: Secondary | ICD-10-CM | POA: Diagnosis not present

## 2020-06-28 DIAGNOSIS — J019 Acute sinusitis, unspecified: Secondary | ICD-10-CM | POA: Diagnosis not present

## 2020-07-04 DIAGNOSIS — B009 Herpesviral infection, unspecified: Secondary | ICD-10-CM | POA: Diagnosis not present

## 2020-07-04 DIAGNOSIS — K219 Gastro-esophageal reflux disease without esophagitis: Secondary | ICD-10-CM | POA: Diagnosis not present

## 2020-07-04 DIAGNOSIS — E7800 Pure hypercholesterolemia, unspecified: Secondary | ICD-10-CM | POA: Diagnosis not present

## 2020-07-04 DIAGNOSIS — Z0000 Encounter for general adult medical examination without abnormal findings: Secondary | ICD-10-CM | POA: Diagnosis not present

## 2020-07-04 DIAGNOSIS — E559 Vitamin D deficiency, unspecified: Secondary | ICD-10-CM | POA: Diagnosis not present

## 2021-01-16 DIAGNOSIS — H5203 Hypermetropia, bilateral: Secondary | ICD-10-CM | POA: Diagnosis not present

## 2021-01-16 DIAGNOSIS — D3132 Benign neoplasm of left choroid: Secondary | ICD-10-CM | POA: Diagnosis not present

## 2021-01-16 DIAGNOSIS — H52203 Unspecified astigmatism, bilateral: Secondary | ICD-10-CM | POA: Diagnosis not present

## 2021-07-08 DIAGNOSIS — Z13220 Encounter for screening for lipoid disorders: Secondary | ICD-10-CM | POA: Diagnosis not present

## 2021-07-08 DIAGNOSIS — E559 Vitamin D deficiency, unspecified: Secondary | ICD-10-CM | POA: Diagnosis not present

## 2021-07-08 DIAGNOSIS — Z0000 Encounter for general adult medical examination without abnormal findings: Secondary | ICD-10-CM | POA: Diagnosis not present

## 2021-07-11 DIAGNOSIS — E7800 Pure hypercholesterolemia, unspecified: Secondary | ICD-10-CM | POA: Diagnosis not present

## 2021-07-11 DIAGNOSIS — I10 Essential (primary) hypertension: Secondary | ICD-10-CM | POA: Diagnosis not present

## 2021-07-11 DIAGNOSIS — Z0000 Encounter for general adult medical examination without abnormal findings: Secondary | ICD-10-CM | POA: Diagnosis not present

## 2021-07-11 DIAGNOSIS — K219 Gastro-esophageal reflux disease without esophagitis: Secondary | ICD-10-CM | POA: Diagnosis not present

## 2021-07-24 DIAGNOSIS — I10 Essential (primary) hypertension: Secondary | ICD-10-CM | POA: Diagnosis not present

## 2021-07-30 DIAGNOSIS — L821 Other seborrheic keratosis: Secondary | ICD-10-CM | POA: Diagnosis not present

## 2021-07-30 DIAGNOSIS — D1801 Hemangioma of skin and subcutaneous tissue: Secondary | ICD-10-CM | POA: Diagnosis not present

## 2021-07-30 DIAGNOSIS — L578 Other skin changes due to chronic exposure to nonionizing radiation: Secondary | ICD-10-CM | POA: Diagnosis not present

## 2021-07-30 DIAGNOSIS — L814 Other melanin hyperpigmentation: Secondary | ICD-10-CM | POA: Diagnosis not present

## 2021-08-20 DIAGNOSIS — H1033 Unspecified acute conjunctivitis, bilateral: Secondary | ICD-10-CM | POA: Diagnosis not present

## 2021-08-27 DIAGNOSIS — H1033 Unspecified acute conjunctivitis, bilateral: Secondary | ICD-10-CM | POA: Diagnosis not present

## 2021-09-03 DIAGNOSIS — H0100B Unspecified blepharitis left eye, upper and lower eyelids: Secondary | ICD-10-CM | POA: Diagnosis not present

## 2021-09-03 DIAGNOSIS — H01113 Allergic dermatitis of right eye, unspecified eyelid: Secondary | ICD-10-CM | POA: Diagnosis not present

## 2021-09-03 DIAGNOSIS — H04123 Dry eye syndrome of bilateral lacrimal glands: Secondary | ICD-10-CM | POA: Diagnosis not present

## 2021-09-03 DIAGNOSIS — H0100A Unspecified blepharitis right eye, upper and lower eyelids: Secondary | ICD-10-CM | POA: Diagnosis not present

## 2021-09-17 DIAGNOSIS — H0100A Unspecified blepharitis right eye, upper and lower eyelids: Secondary | ICD-10-CM | POA: Diagnosis not present

## 2021-09-17 DIAGNOSIS — H0100B Unspecified blepharitis left eye, upper and lower eyelids: Secondary | ICD-10-CM | POA: Diagnosis not present

## 2021-10-31 DIAGNOSIS — H16203 Unspecified keratoconjunctivitis, bilateral: Secondary | ICD-10-CM | POA: Diagnosis not present

## 2021-11-06 DIAGNOSIS — S39012A Strain of muscle, fascia and tendon of lower back, initial encounter: Secondary | ICD-10-CM | POA: Diagnosis not present

## 2021-11-06 DIAGNOSIS — W2104XA Struck by golf ball, initial encounter: Secondary | ICD-10-CM | POA: Diagnosis not present

## 2021-11-14 DIAGNOSIS — M5441 Lumbago with sciatica, right side: Secondary | ICD-10-CM | POA: Diagnosis not present

## 2021-11-14 DIAGNOSIS — I10 Essential (primary) hypertension: Secondary | ICD-10-CM | POA: Diagnosis not present

## 2021-11-14 DIAGNOSIS — M25521 Pain in right elbow: Secondary | ICD-10-CM | POA: Diagnosis not present

## 2021-11-20 DIAGNOSIS — M545 Low back pain, unspecified: Secondary | ICD-10-CM | POA: Diagnosis not present

## 2021-11-20 DIAGNOSIS — M25521 Pain in right elbow: Secondary | ICD-10-CM | POA: Diagnosis not present

## 2021-11-20 DIAGNOSIS — R269 Unspecified abnormalities of gait and mobility: Secondary | ICD-10-CM | POA: Diagnosis not present

## 2021-11-20 DIAGNOSIS — M79604 Pain in right leg: Secondary | ICD-10-CM | POA: Diagnosis not present

## 2021-11-25 DIAGNOSIS — M25521 Pain in right elbow: Secondary | ICD-10-CM | POA: Diagnosis not present

## 2021-11-25 DIAGNOSIS — M545 Low back pain, unspecified: Secondary | ICD-10-CM | POA: Diagnosis not present

## 2021-11-25 DIAGNOSIS — R269 Unspecified abnormalities of gait and mobility: Secondary | ICD-10-CM | POA: Diagnosis not present

## 2021-11-25 DIAGNOSIS — M79604 Pain in right leg: Secondary | ICD-10-CM | POA: Diagnosis not present

## 2021-12-04 DIAGNOSIS — M25521 Pain in right elbow: Secondary | ICD-10-CM | POA: Diagnosis not present

## 2021-12-04 DIAGNOSIS — M545 Low back pain, unspecified: Secondary | ICD-10-CM | POA: Diagnosis not present

## 2021-12-04 DIAGNOSIS — R269 Unspecified abnormalities of gait and mobility: Secondary | ICD-10-CM | POA: Diagnosis not present

## 2021-12-04 DIAGNOSIS — M79604 Pain in right leg: Secondary | ICD-10-CM | POA: Diagnosis not present

## 2021-12-16 DIAGNOSIS — M79604 Pain in right leg: Secondary | ICD-10-CM | POA: Diagnosis not present

## 2021-12-16 DIAGNOSIS — M545 Low back pain, unspecified: Secondary | ICD-10-CM | POA: Diagnosis not present

## 2021-12-16 DIAGNOSIS — R269 Unspecified abnormalities of gait and mobility: Secondary | ICD-10-CM | POA: Diagnosis not present

## 2021-12-16 DIAGNOSIS — M25521 Pain in right elbow: Secondary | ICD-10-CM | POA: Diagnosis not present

## 2021-12-31 DIAGNOSIS — M25521 Pain in right elbow: Secondary | ICD-10-CM | POA: Diagnosis not present

## 2021-12-31 DIAGNOSIS — R269 Unspecified abnormalities of gait and mobility: Secondary | ICD-10-CM | POA: Diagnosis not present

## 2021-12-31 DIAGNOSIS — M545 Low back pain, unspecified: Secondary | ICD-10-CM | POA: Diagnosis not present

## 2021-12-31 DIAGNOSIS — M79604 Pain in right leg: Secondary | ICD-10-CM | POA: Diagnosis not present

## 2022-03-04 DIAGNOSIS — H16223 Keratoconjunctivitis sicca, not specified as Sjogren's, bilateral: Secondary | ICD-10-CM | POA: Diagnosis not present

## 2022-03-04 DIAGNOSIS — H5203 Hypermetropia, bilateral: Secondary | ICD-10-CM | POA: Diagnosis not present

## 2022-03-04 DIAGNOSIS — H52203 Unspecified astigmatism, bilateral: Secondary | ICD-10-CM | POA: Diagnosis not present

## 2022-03-04 DIAGNOSIS — H0100B Unspecified blepharitis left eye, upper and lower eyelids: Secondary | ICD-10-CM | POA: Diagnosis not present

## 2022-03-04 DIAGNOSIS — H0100A Unspecified blepharitis right eye, upper and lower eyelids: Secondary | ICD-10-CM | POA: Diagnosis not present

## 2022-06-03 DIAGNOSIS — H0100A Unspecified blepharitis right eye, upper and lower eyelids: Secondary | ICD-10-CM | POA: Diagnosis not present

## 2022-06-03 DIAGNOSIS — H16223 Keratoconjunctivitis sicca, not specified as Sjogren's, bilateral: Secondary | ICD-10-CM | POA: Diagnosis not present

## 2022-06-03 DIAGNOSIS — H0100B Unspecified blepharitis left eye, upper and lower eyelids: Secondary | ICD-10-CM | POA: Diagnosis not present

## 2022-07-15 DIAGNOSIS — Z0000 Encounter for general adult medical examination without abnormal findings: Secondary | ICD-10-CM | POA: Diagnosis not present

## 2022-07-15 DIAGNOSIS — Z13220 Encounter for screening for lipoid disorders: Secondary | ICD-10-CM | POA: Diagnosis not present

## 2022-07-21 DIAGNOSIS — Z0000 Encounter for general adult medical examination without abnormal findings: Secondary | ICD-10-CM | POA: Diagnosis not present

## 2022-07-21 DIAGNOSIS — R945 Abnormal results of liver function studies: Secondary | ICD-10-CM | POA: Diagnosis not present

## 2022-07-21 DIAGNOSIS — K219 Gastro-esophageal reflux disease without esophagitis: Secondary | ICD-10-CM | POA: Diagnosis not present

## 2022-07-21 DIAGNOSIS — E7800 Pure hypercholesterolemia, unspecified: Secondary | ICD-10-CM | POA: Diagnosis not present

## 2022-07-21 DIAGNOSIS — I10 Essential (primary) hypertension: Secondary | ICD-10-CM | POA: Diagnosis not present

## 2022-07-31 DIAGNOSIS — L578 Other skin changes due to chronic exposure to nonionizing radiation: Secondary | ICD-10-CM | POA: Diagnosis not present

## 2022-07-31 DIAGNOSIS — L821 Other seborrheic keratosis: Secondary | ICD-10-CM | POA: Diagnosis not present

## 2022-07-31 DIAGNOSIS — D1801 Hemangioma of skin and subcutaneous tissue: Secondary | ICD-10-CM | POA: Diagnosis not present

## 2022-07-31 DIAGNOSIS — L814 Other melanin hyperpigmentation: Secondary | ICD-10-CM | POA: Diagnosis not present

## 2022-09-18 DIAGNOSIS — M549 Dorsalgia, unspecified: Secondary | ICD-10-CM | POA: Diagnosis not present

## 2022-09-18 DIAGNOSIS — I10 Essential (primary) hypertension: Secondary | ICD-10-CM | POA: Diagnosis not present

## 2022-09-18 DIAGNOSIS — E7800 Pure hypercholesterolemia, unspecified: Secondary | ICD-10-CM | POA: Diagnosis not present

## 2022-09-21 DIAGNOSIS — M545 Low back pain, unspecified: Secondary | ICD-10-CM | POA: Diagnosis not present

## 2022-11-14 DIAGNOSIS — R6882 Decreased libido: Secondary | ICD-10-CM | POA: Diagnosis not present

## 2022-11-14 DIAGNOSIS — R945 Abnormal results of liver function studies: Secondary | ICD-10-CM | POA: Diagnosis not present

## 2022-11-14 DIAGNOSIS — R5381 Other malaise: Secondary | ICD-10-CM | POA: Diagnosis not present

## 2022-11-20 DIAGNOSIS — E349 Endocrine disorder, unspecified: Secondary | ICD-10-CM | POA: Diagnosis not present

## 2022-11-20 DIAGNOSIS — R635 Abnormal weight gain: Secondary | ICD-10-CM | POA: Diagnosis not present

## 2022-11-21 DIAGNOSIS — E349 Endocrine disorder, unspecified: Secondary | ICD-10-CM | POA: Diagnosis not present

## 2022-12-04 DIAGNOSIS — E291 Testicular hypofunction: Secondary | ICD-10-CM | POA: Diagnosis not present

## 2022-12-12 DIAGNOSIS — S86912A Strain of unspecified muscle(s) and tendon(s) at lower leg level, left leg, initial encounter: Secondary | ICD-10-CM | POA: Diagnosis not present

## 2022-12-17 DIAGNOSIS — E291 Testicular hypofunction: Secondary | ICD-10-CM | POA: Diagnosis not present

## 2022-12-29 DIAGNOSIS — E291 Testicular hypofunction: Secondary | ICD-10-CM | POA: Diagnosis not present

## 2023-01-08 ENCOUNTER — Other Ambulatory Visit: Payer: Self-pay | Admitting: Nurse Practitioner

## 2023-01-08 DIAGNOSIS — E230 Hypopituitarism: Secondary | ICD-10-CM

## 2023-02-12 ENCOUNTER — Encounter: Payer: Self-pay | Admitting: Nurse Practitioner

## 2023-02-15 ENCOUNTER — Ambulatory Visit
Admission: RE | Admit: 2023-02-15 | Discharge: 2023-02-15 | Disposition: A | Discharge status: Home / Self Care | Payer: Self-pay | Source: Ambulatory Visit | Attending: Nurse Practitioner | Admitting: Nurse Practitioner

## 2023-02-15 DIAGNOSIS — E230 Hypopituitarism: Secondary | ICD-10-CM

## 2023-02-15 MED ORDER — GADOPICLENOL 0.5 MMOL/ML IV SOLN
10.0000 mL | Freq: Once | INTRAVENOUS | Status: AC | PRN
  Administered 2023-02-15: 10 mL via INTRAVENOUS

## 2023-02-20 DIAGNOSIS — E291 Testicular hypofunction: Secondary | ICD-10-CM | POA: Diagnosis not present

## 2023-02-25 DIAGNOSIS — R059 Cough, unspecified: Secondary | ICD-10-CM | POA: Diagnosis not present

## 2023-02-25 DIAGNOSIS — J019 Acute sinusitis, unspecified: Secondary | ICD-10-CM | POA: Diagnosis not present

## 2023-02-27 DIAGNOSIS — E291 Testicular hypofunction: Secondary | ICD-10-CM | POA: Diagnosis not present

## 2023-03-09 DIAGNOSIS — H01001 Unspecified blepharitis right upper eyelid: Secondary | ICD-10-CM | POA: Diagnosis not present

## 2023-03-09 DIAGNOSIS — H01004 Unspecified blepharitis left upper eyelid: Secondary | ICD-10-CM | POA: Diagnosis not present

## 2023-10-27 DIAGNOSIS — E291 Testicular hypofunction: Secondary | ICD-10-CM | POA: Diagnosis not present

## 2023-11-03 DIAGNOSIS — E291 Testicular hypofunction: Secondary | ICD-10-CM | POA: Diagnosis not present

## 2023-11-09 DIAGNOSIS — J3089 Other allergic rhinitis: Secondary | ICD-10-CM | POA: Diagnosis not present

## 2023-11-09 DIAGNOSIS — J0191 Acute recurrent sinusitis, unspecified: Secondary | ICD-10-CM | POA: Diagnosis not present

## 2023-11-09 DIAGNOSIS — J301 Allergic rhinitis due to pollen: Secondary | ICD-10-CM | POA: Diagnosis not present

## 2023-11-09 DIAGNOSIS — J3081 Allergic rhinitis due to animal (cat) (dog) hair and dander: Secondary | ICD-10-CM | POA: Diagnosis not present

## 2023-11-11 DIAGNOSIS — J3081 Allergic rhinitis due to animal (cat) (dog) hair and dander: Secondary | ICD-10-CM | POA: Diagnosis not present

## 2023-11-11 DIAGNOSIS — J301 Allergic rhinitis due to pollen: Secondary | ICD-10-CM | POA: Diagnosis not present

## 2023-11-12 DIAGNOSIS — J3089 Other allergic rhinitis: Secondary | ICD-10-CM | POA: Diagnosis not present

## 2023-11-18 DIAGNOSIS — J3081 Allergic rhinitis due to animal (cat) (dog) hair and dander: Secondary | ICD-10-CM | POA: Diagnosis not present

## 2023-11-18 DIAGNOSIS — J3089 Other allergic rhinitis: Secondary | ICD-10-CM | POA: Diagnosis not present

## 2023-11-18 DIAGNOSIS — J301 Allergic rhinitis due to pollen: Secondary | ICD-10-CM | POA: Diagnosis not present

## 2023-11-20 DIAGNOSIS — J301 Allergic rhinitis due to pollen: Secondary | ICD-10-CM | POA: Diagnosis not present

## 2023-11-20 DIAGNOSIS — J3089 Other allergic rhinitis: Secondary | ICD-10-CM | POA: Diagnosis not present

## 2023-11-20 DIAGNOSIS — J3081 Allergic rhinitis due to animal (cat) (dog) hair and dander: Secondary | ICD-10-CM | POA: Diagnosis not present

## 2023-11-23 DIAGNOSIS — J3081 Allergic rhinitis due to animal (cat) (dog) hair and dander: Secondary | ICD-10-CM | POA: Diagnosis not present

## 2023-11-23 DIAGNOSIS — J3089 Other allergic rhinitis: Secondary | ICD-10-CM | POA: Diagnosis not present

## 2023-11-23 DIAGNOSIS — J301 Allergic rhinitis due to pollen: Secondary | ICD-10-CM | POA: Diagnosis not present

## 2023-11-25 DIAGNOSIS — J3089 Other allergic rhinitis: Secondary | ICD-10-CM | POA: Diagnosis not present

## 2023-11-25 DIAGNOSIS — J3081 Allergic rhinitis due to animal (cat) (dog) hair and dander: Secondary | ICD-10-CM | POA: Diagnosis not present

## 2023-11-25 DIAGNOSIS — J301 Allergic rhinitis due to pollen: Secondary | ICD-10-CM | POA: Diagnosis not present

## 2023-12-02 DIAGNOSIS — J3089 Other allergic rhinitis: Secondary | ICD-10-CM | POA: Diagnosis not present

## 2023-12-02 DIAGNOSIS — J3081 Allergic rhinitis due to animal (cat) (dog) hair and dander: Secondary | ICD-10-CM | POA: Diagnosis not present

## 2023-12-02 DIAGNOSIS — J301 Allergic rhinitis due to pollen: Secondary | ICD-10-CM | POA: Diagnosis not present

## 2023-12-04 DIAGNOSIS — J3089 Other allergic rhinitis: Secondary | ICD-10-CM | POA: Diagnosis not present

## 2023-12-04 DIAGNOSIS — J301 Allergic rhinitis due to pollen: Secondary | ICD-10-CM | POA: Diagnosis not present

## 2023-12-04 DIAGNOSIS — J3081 Allergic rhinitis due to animal (cat) (dog) hair and dander: Secondary | ICD-10-CM | POA: Diagnosis not present

## 2023-12-07 DIAGNOSIS — J3089 Other allergic rhinitis: Secondary | ICD-10-CM | POA: Diagnosis not present

## 2023-12-07 DIAGNOSIS — J301 Allergic rhinitis due to pollen: Secondary | ICD-10-CM | POA: Diagnosis not present

## 2023-12-07 DIAGNOSIS — J3081 Allergic rhinitis due to animal (cat) (dog) hair and dander: Secondary | ICD-10-CM | POA: Diagnosis not present

## 2023-12-09 DIAGNOSIS — J301 Allergic rhinitis due to pollen: Secondary | ICD-10-CM | POA: Diagnosis not present

## 2023-12-09 DIAGNOSIS — J3081 Allergic rhinitis due to animal (cat) (dog) hair and dander: Secondary | ICD-10-CM | POA: Diagnosis not present

## 2023-12-09 DIAGNOSIS — J3089 Other allergic rhinitis: Secondary | ICD-10-CM | POA: Diagnosis not present

## 2023-12-11 DIAGNOSIS — J301 Allergic rhinitis due to pollen: Secondary | ICD-10-CM | POA: Diagnosis not present

## 2023-12-11 DIAGNOSIS — J3089 Other allergic rhinitis: Secondary | ICD-10-CM | POA: Diagnosis not present

## 2023-12-11 DIAGNOSIS — J3081 Allergic rhinitis due to animal (cat) (dog) hair and dander: Secondary | ICD-10-CM | POA: Diagnosis not present

## 2023-12-14 DIAGNOSIS — J3089 Other allergic rhinitis: Secondary | ICD-10-CM | POA: Diagnosis not present

## 2023-12-14 DIAGNOSIS — J3081 Allergic rhinitis due to animal (cat) (dog) hair and dander: Secondary | ICD-10-CM | POA: Diagnosis not present

## 2023-12-14 DIAGNOSIS — J301 Allergic rhinitis due to pollen: Secondary | ICD-10-CM | POA: Diagnosis not present

## 2023-12-15 DIAGNOSIS — E291 Testicular hypofunction: Secondary | ICD-10-CM | POA: Diagnosis not present

## 2023-12-18 DIAGNOSIS — J3081 Allergic rhinitis due to animal (cat) (dog) hair and dander: Secondary | ICD-10-CM | POA: Diagnosis not present

## 2023-12-18 DIAGNOSIS — J301 Allergic rhinitis due to pollen: Secondary | ICD-10-CM | POA: Diagnosis not present

## 2023-12-18 DIAGNOSIS — J3089 Other allergic rhinitis: Secondary | ICD-10-CM | POA: Diagnosis not present

## 2024-01-13 DIAGNOSIS — J0100 Acute maxillary sinusitis, unspecified: Secondary | ICD-10-CM | POA: Diagnosis not present

## 2024-03-10 DIAGNOSIS — H52203 Unspecified astigmatism, bilateral: Secondary | ICD-10-CM | POA: Diagnosis not present

## 2024-03-10 DIAGNOSIS — H5203 Hypermetropia, bilateral: Secondary | ICD-10-CM | POA: Diagnosis not present

## 2024-03-10 DIAGNOSIS — H01001 Unspecified blepharitis right upper eyelid: Secondary | ICD-10-CM | POA: Diagnosis not present

## 2024-03-10 DIAGNOSIS — H01004 Unspecified blepharitis left upper eyelid: Secondary | ICD-10-CM | POA: Diagnosis not present
